# Patient Record
Sex: Female | Born: 1975 | Race: White | Hispanic: No | Marital: Married | Smoking: Never smoker
Health system: Southern US, Community
[De-identification: ages and names within clinical notes are randomized; demographics above are authoritative.]

## PROBLEM LIST (undated history)

## (undated) HISTORY — PX: LAPAROSCOPY: SHX197

---

## 2006-01-29 ENCOUNTER — Inpatient Hospital Stay (HOSPITAL_COMMUNITY): Admission: RE | Admit: 2006-01-29 | Discharge: 2006-01-31 | Payer: Self-pay | Admitting: Obstetrics and Gynecology

## 2017-07-19 DIAGNOSIS — Z6827 Body mass index (BMI) 27.0-27.9, adult: Secondary | ICD-10-CM | POA: Diagnosis not present

## 2017-07-19 DIAGNOSIS — Z0000 Encounter for general adult medical examination without abnormal findings: Secondary | ICD-10-CM | POA: Diagnosis not present

## 2017-07-26 DIAGNOSIS — Z1231 Encounter for screening mammogram for malignant neoplasm of breast: Secondary | ICD-10-CM | POA: Diagnosis not present

## 2020-02-23 ENCOUNTER — Other Ambulatory Visit: Payer: Self-pay

## 2020-02-23 ENCOUNTER — Ambulatory Visit (INDEPENDENT_AMBULATORY_CARE_PROVIDER_SITE_OTHER): Payer: 59

## 2020-02-23 ENCOUNTER — Ambulatory Visit
Admission: EM | Admit: 2020-02-23 | Discharge: 2020-02-23 | Disposition: A | Discharge status: Home / Self Care | Payer: 59 | Attending: Emergency Medicine | Admitting: Emergency Medicine

## 2020-02-23 DIAGNOSIS — M549 Dorsalgia, unspecified: Secondary | ICD-10-CM | POA: Diagnosis not present

## 2020-02-23 DIAGNOSIS — M545 Low back pain, unspecified: Secondary | ICD-10-CM

## 2020-02-23 MED ORDER — NAPROXEN 500 MG PO TABS: 500.0000 mg | ORAL_TABLET | Freq: Two times a day (BID) | ORAL | 0 refills | Status: AC

## 2020-02-23 MED ORDER — CYCLOBENZAPRINE HCL 5 MG PO TABS: 5.0000 mg | ORAL_TABLET | Freq: Three times a day (TID) | ORAL | 0 refills | Status: AC | PRN
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
## 2020-03-04 ENCOUNTER — Other Ambulatory Visit (HOSPITAL_COMMUNITY): Payer: Self-pay | Admitting: Family Medicine

## 2020-03-04 DIAGNOSIS — Z1231 Encounter for screening mammogram for malignant neoplasm of breast: Secondary | ICD-10-CM
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
